# Patient Record
Sex: Male | Born: 1960
Health system: Southern US, Community
[De-identification: ages and names within clinical notes are randomized; demographics above are authoritative.]

---

## 2001-07-01 ENCOUNTER — Encounter: Payer: Self-pay | Admitting: Orthopaedic Surgery

## 2001-07-01 ENCOUNTER — Ambulatory Visit (HOSPITAL_COMMUNITY): Admission: RE | Admit: 2001-07-01 | Discharge: 2001-07-01 | Payer: Self-pay | Admitting: Orthopaedic Surgery

## 2003-09-13 IMAGING — CT CT RECONSTRUCTION
3 series · 16 of 33 positions shown, 19 images · non-contrast
Comparison: none

FINDINGS
CLINICAL DATA: RIGHT ELBOW PAIN.  RADIAL HEAD FRACTURE.
CT RIGHT ELBOW WITHOUT CONTRAST MEDIA/MULTIPLANAR RECONSTRUCTIONS
MULTIPLE CONTIGUOUS AXIAL IMAGES THROUGH THE RIGHT ELBOW PERFORMED AT 1.25 MM COLLIMATION.
SAGITTAL AND CORONAL REFORMATTED IMAGES PERFORMED.
THERE IS A NONDISPLACED RADIAL HEAD FRACTURE.  NO EVIDENCE OF SUBLUXATION OR DISLOCATION NOTED.  NO
EVIDENCE OF LOOSE BODY. NO OTHER SIGNIFICANT ABNORMALITIES.
IMPRESSION
NON-DISPLACED RADIAL HEAD FRACTURE.  NO EVIDENCE OF LOOSE BODY IN THE JOINT.

[Series 8200: — · axial · 0.40mm/px · z∈[-853,-772]mm · 8 of 161 slices shown, 10 images (1 of 3)]
[im 13/161  soft-tissue]
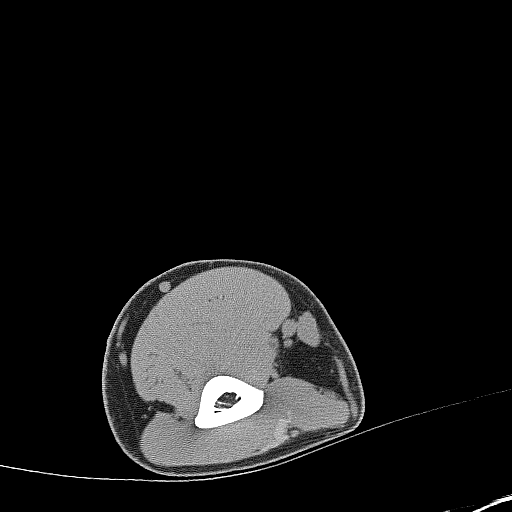
[im 13/161  bone]
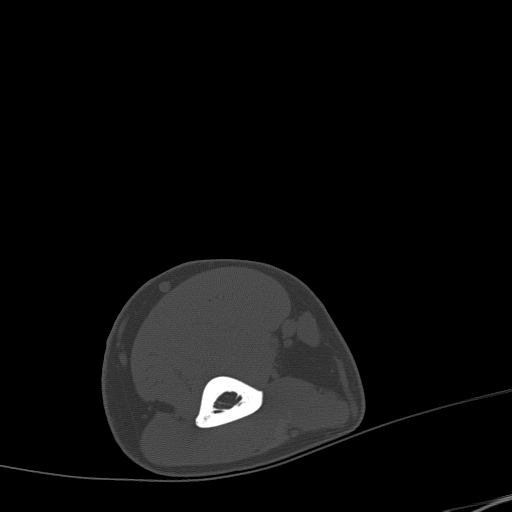
[im 37/161  bone]
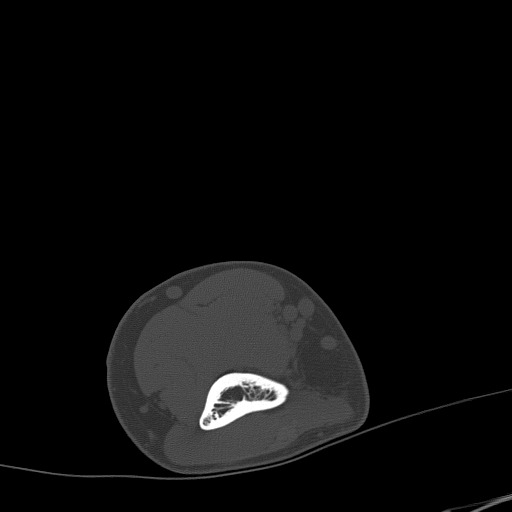
[im 50/161  bone]
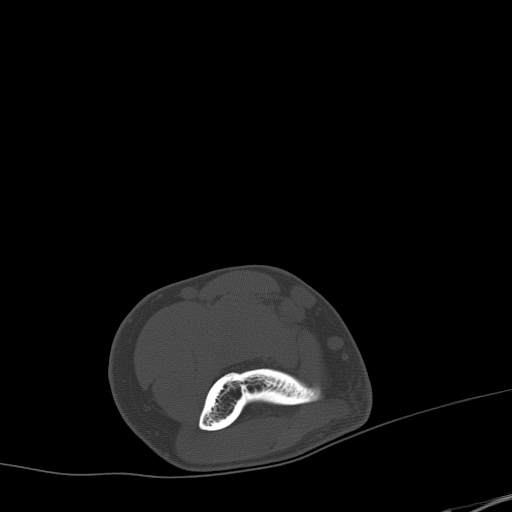
[im 74/161  bone]
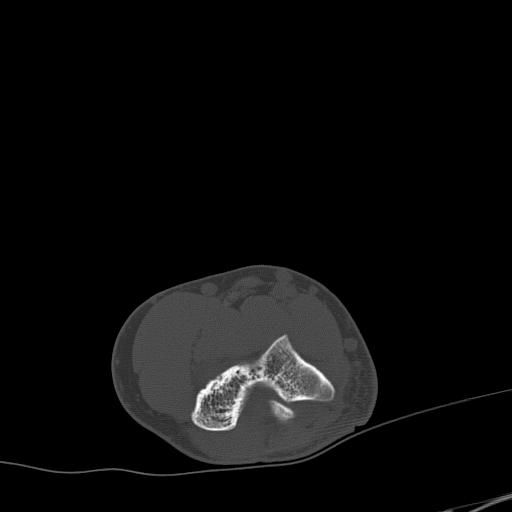
[im 87/161  soft-tissue]
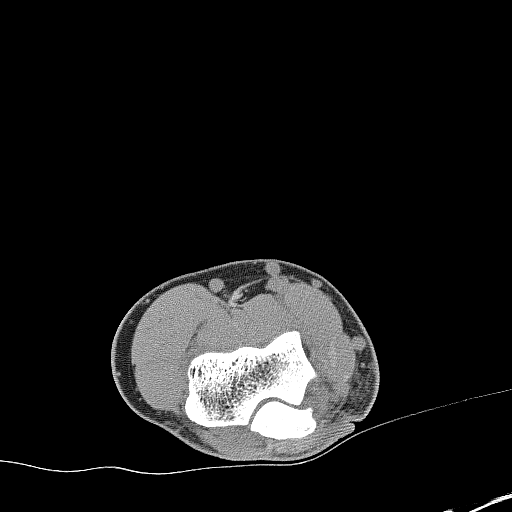
[im 87/161  bone]
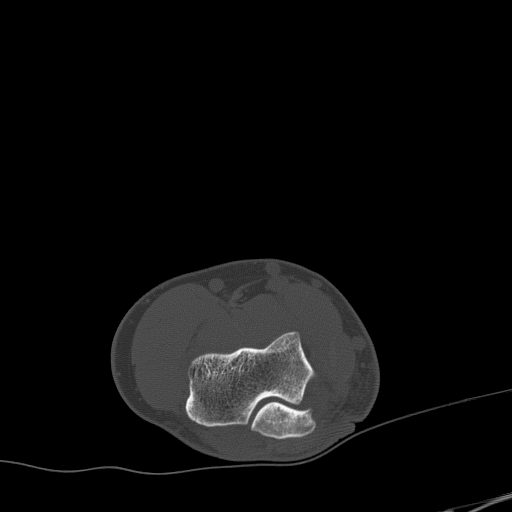
[im 111/161  bone]
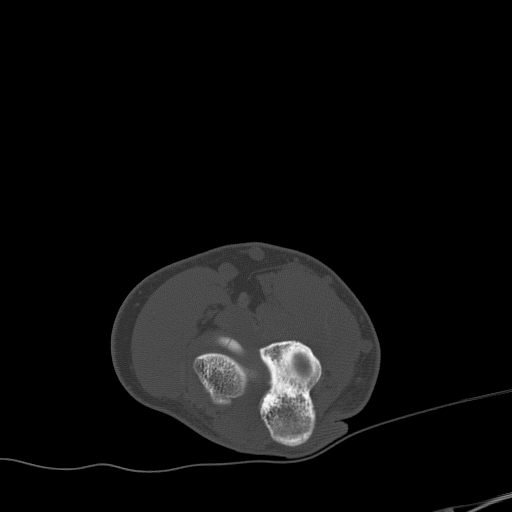
[im 124/161  bone]
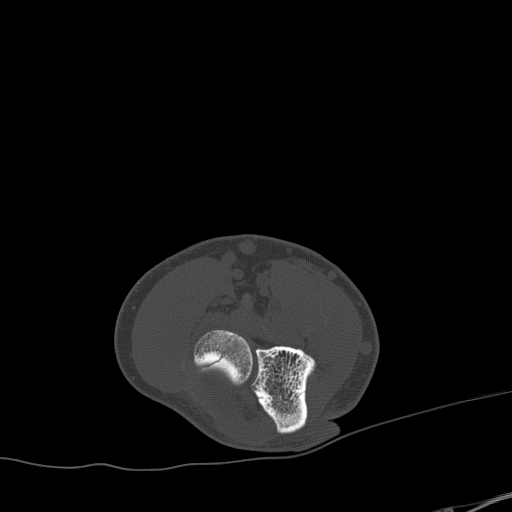
[im 148/161  bone]
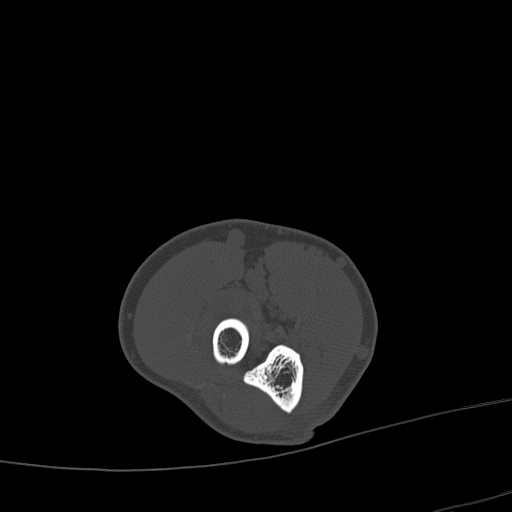

[— · sagittal · 0.40mm/px · 5 of 20 slices shown, 6 images (2 of 3)]
[im 7/20  bone]
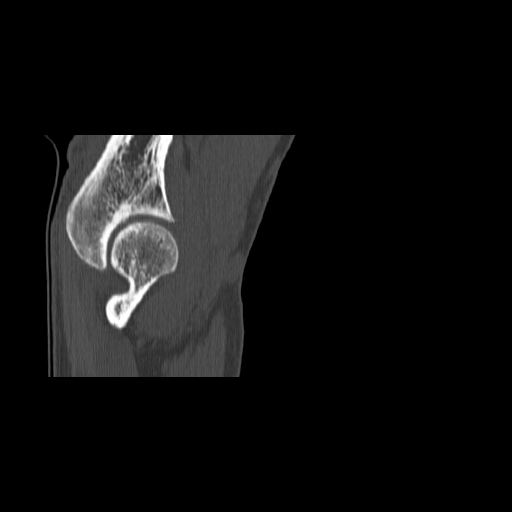
[im 8/20  bone]
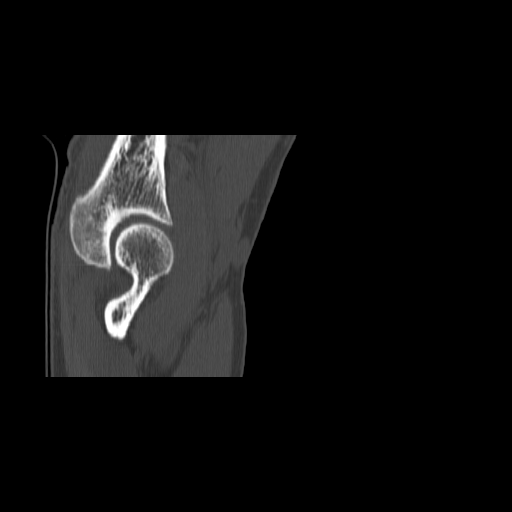
[im 10/20  soft-tissue]
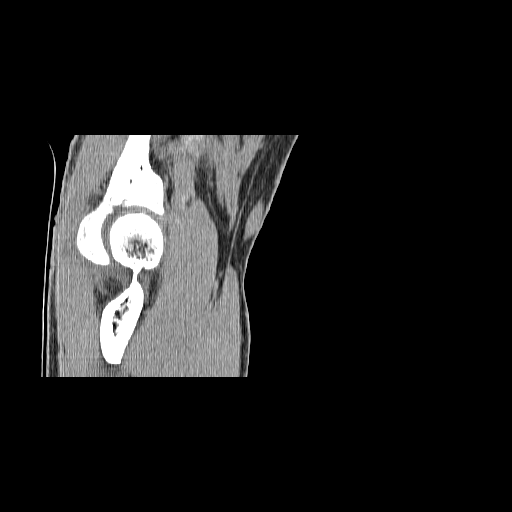
[im 10/20  bone]
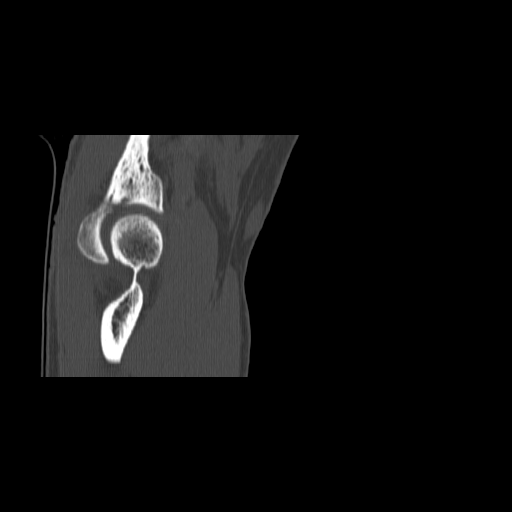
[im 12/20  bone]
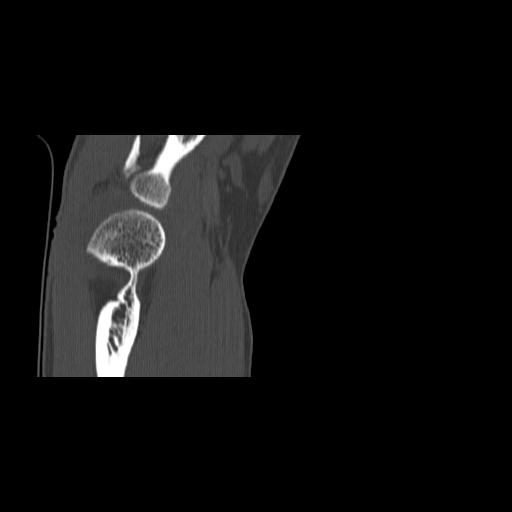
[im 13/20  bone]
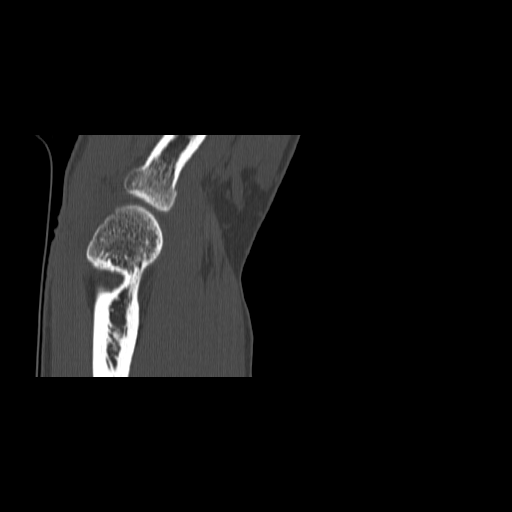

[— · coronal · 0.40mm/px · 3 of 19 slices shown (3 of 3)]
[im 4/19  bone]
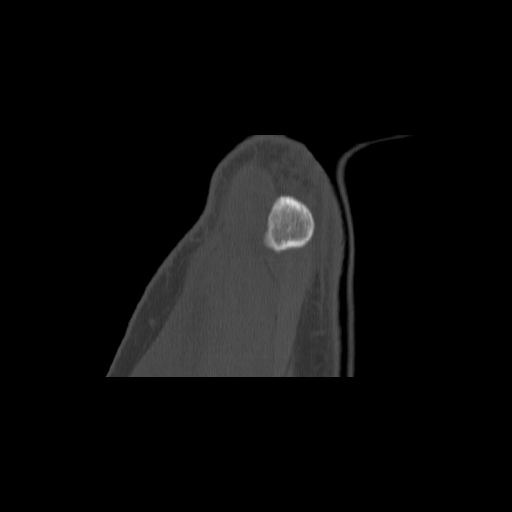
[im 8/19  bone]
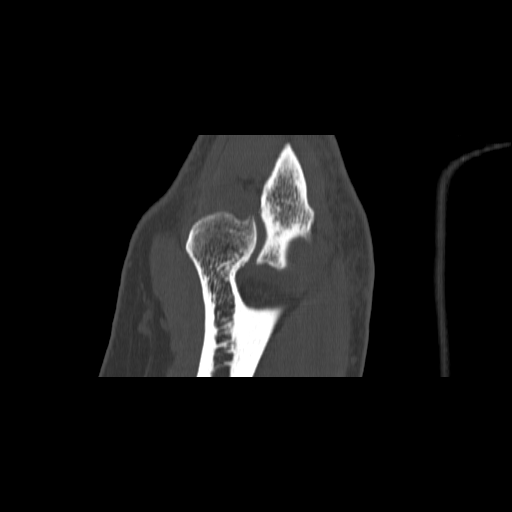
[im 11/19  bone]
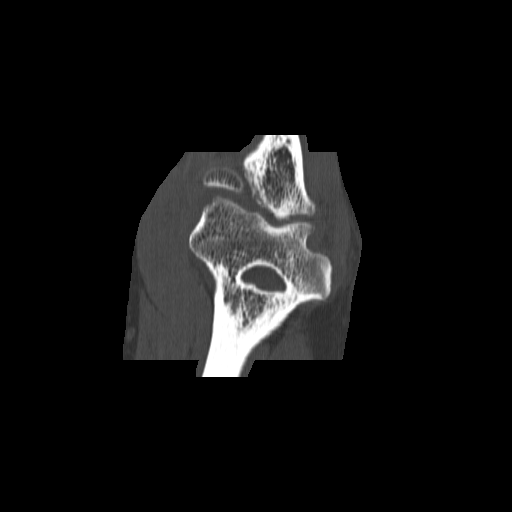

[16 of 33 positions shown; findings below may reference images not displayed]

## 2020-06-07 ENCOUNTER — Other Ambulatory Visit: Payer: Self-pay

## 2020-06-07 ENCOUNTER — Ambulatory Visit
Admission: EM | Admit: 2020-06-07 | Discharge: 2020-06-07 | Disposition: A | Discharge status: Home / Self Care | Payer: BC Managed Care – PPO | Attending: Emergency Medicine | Admitting: Emergency Medicine

## 2020-06-07 ENCOUNTER — Encounter: Payer: Self-pay | Admitting: Emergency Medicine

## 2020-06-07 DIAGNOSIS — R519 Headache, unspecified: Secondary | ICD-10-CM

## 2020-06-07 DIAGNOSIS — Z1152 Encounter for screening for COVID-19: Secondary | ICD-10-CM

## 2020-06-07 DIAGNOSIS — R509 Fever, unspecified: Secondary | ICD-10-CM

## 2020-06-07 DIAGNOSIS — J069 Acute upper respiratory infection, unspecified: Secondary | ICD-10-CM

## 2020-06-07 DIAGNOSIS — R52 Pain, unspecified: Secondary | ICD-10-CM | POA: Diagnosis not present

## 2020-06-07 MED ORDER — PREDNISONE 10 MG PO TABS: 20.0000 mg | ORAL_TABLET | Freq: Every day | ORAL | 0 refills | Status: AC

## 2020-06-07 MED ORDER — BENZONATATE 100 MG PO CAPS: 100.0000 mg | ORAL_CAPSULE | Freq: Three times a day (TID) | ORAL | 0 refills | Status: DC | PRN

## 2020-06-07 MED ORDER — CETIRIZINE HCL 10 MG PO TABS: 10.0000 mg | ORAL_TABLET | Freq: Every day | ORAL | 0 refills | Status: AC

## 2020-06-07 MED ORDER — KETOROLAC TROMETHAMINE 60 MG/2ML IM SOLN
30.0000 mg | Freq: Once | INTRAMUSCULAR | Status: AC
  Administered 2020-06-07: 30 mg via INTRAMUSCULAR

## 2020-06-07 MED ORDER — BENZONATATE 100 MG PO CAPS: 100.0000 mg | ORAL_CAPSULE | Freq: Four times a day (QID) | ORAL | 0 refills | Status: AC | PRN

## 2020-06-07 MED ORDER — ALBUTEROL SULFATE HFA 108 (90 BASE) MCG/ACT IN AERS: 1.0000 | INHALATION_SPRAY | Freq: Four times a day (QID) | RESPIRATORY_TRACT | 0 refills | Status: AC | PRN

## 2020-06-07 NOTE — Discharge Instructions (Signed)
COVID testing ordered.  It will take between 2-7 days for test results.  Someone will contact you regarding abnormal results.     Get plenty of rest and push fluids Tessalon Perles prescribed for cough Zyrtec for nasal congestion, runny nose, and/or sore throat Albuterol was prescribed for shortness of breath prednisone was prescribed Use medications daily for symptom relief Use OTC medications like ibuprofen or tylenol as needed fever or pain Call or go to the ED if you have any new or worsening symptoms such as fever, worsening cough, shortness of breath, chest tightness, chest pain, turning blue, changes in mental status, etc..Marland Kitchen

## 2020-06-07 NOTE — ED Triage Notes (Signed)
Head hurts, body aches, cough, fever and shortness of breath that started today.

## 2020-06-07 NOTE — ED Provider Notes (Signed)
[facility]   [id number] 06/07/20 Arrival Time: 0936   CC: COVID symptoms  SUBJECTIVE: History from: patient.  Cailan General is a 60 y.o. male who presented to the urgent care for complaint of fever, cough, shortness of breath, headache and body aches that started today.  Denies known sick exposure to COVID, flu or strep.  Denies recent travel.  Has tried OTC medication without relief.  Denies alleviating or aggravating factors.  Denies previous symptoms in the past.   Denies fever, chills, fatigue, sinus pain, rhinorrhea, sore throat, SOB, wheezing, chest pain, nausea, changes in bowel or bladder habits.     ROS: As per HPI.  All other pertinent ROS negative.      History reviewed. No pertinent past medical history. History reviewed. No pertinent surgical history. No Known Allergies No current facility-administered medications on file prior to encounter.   No current outpatient medications on file prior to encounter.   Social History   Socioeconomic History  . Marital status: Single    Spouse name: Not on file  . Number of children: Not on file  . Years of education: Not on file  . Highest education level: Not on file  Occupational History  . Not on file  Tobacco Use  . Smoking status: Never Smoker  . Smokeless tobacco: Never Used  Substance and Sexual Activity  . Alcohol use: Never  . Drug use: Never  . Sexual activity: Not on file  Other Topics Concern  . Not on file  Social History Narrative  . Not on file   Social Determinants of Health   Financial Resource Strain: Not on file  Food Insecurity: Not on file  Transportation Needs: Not on file  Physical Activity: Not on file  Stress: Not on file  Social Connections: Not on file  Intimate Partner Violence: Not on file   No family history on file.  OBJECTIVE:  Vitals:   06/07/20 1001 06/07/20 1003  BP:  110/78  Pulse:  97  Resp:  19  Temp:  99.1 F (37.3 C)  TempSrc:  Oral  SpO2:  95%   Weight: 175 lb (79.4 kg)   Height: 5\' 11"  (1.803 m)      General appearance: alert; appears fatigued, but nontoxic; speaking in full sentences and tolerating own secretions HEENT: NCAT; Ears: EACs clear, TMs pearly gray; Eyes: PERRL.  EOM grossly intact. Sinuses: nontender; Nose: nares patent without rhinorrhea, Throat: oropharynx clear, tonsils non erythematous or enlarged, uvula midline  Neck: supple without LAD Lungs: unlabored respirations, symmetrical air entry; cough: moderate; no respiratory distress; CTAB Heart: regular rate and rhythm.  Radial pulses 2+ symmetrical bilaterally Skin: warm and dry Psychological: alert and cooperative; normal mood and affect  LABS:  No results found for this or any previous visit (from the past 24 hour(s)).   ASSESSMENT & PLAN:  1. Encounter for screening for COVID-19   2. Body aches   3. Viral URI with cough   4. Fever, unspecified   5. Acute nonintractable headache, unspecified headache type     Meds ordered this encounter  Medications  . benzonatate (TESSALON) 100 MG capsule    Sig: Take 1 capsule (100 mg total) by mouth 3 (three) times daily as needed for cough.    Dispense:  30 capsule    Refill:  0  . albuterol (VENTOLIN HFA) 108 (90 Base) MCG/ACT inhaler    Sig: Inhale 1-2 puffs into the lungs every 6 (six) hours as needed for wheezing  or shortness of breath.    Dispense:  18 g    Refill:  0  . cetirizine (ZYRTEC ALLERGY) 10 MG tablet    Sig: Take 1 tablet (10 mg total) by mouth daily.    Dispense:  30 tablet    Refill:  0  . predniSONE (DELTASONE) 10 MG tablet    Sig: Take 2 tablets (20 mg total) by mouth daily.    Dispense:  15 tablet    Refill:  0  . ketorolac (TORADOL) injection 30 mg    Discharge instructions  COVID testing ordered.  It will take between 2-7 days for test results.  Someone will contact you regarding abnormal results.     Get plenty of rest and push fluids Tessalon Perles prescribed for  cough Zyrtec for nasal congestion, runny nose, and/or sore throat Albuterol was prescribed for shortness of breath prednisone was prescribed Use medications daily for symptom relief Use OTC medications like ibuprofen or tylenol as needed fever or pain Call or go to the ED if you have any new or worsening symptoms such as fever, worsening cough, shortness of breath, chest tightness, chest pain, turning blue, changes in mental status, etc...   Reviewed expectations re: course of current medical issues. Questions answered. Outlined signs and symptoms indicating need for more acute intervention. Patient verbalized understanding. After Visit Summary given.         Durward Parcel, FNP 06/07/20 1031

## 2020-06-09 LAB — COVID-19, FLU A+B NAA
Influenza A, NAA: NOT DETECTED
Influenza B, NAA: NOT DETECTED
SARS-CoV-2, NAA: DETECTED — AB

## 2020-06-13 ENCOUNTER — Ambulatory Visit (INDEPENDENT_AMBULATORY_CARE_PROVIDER_SITE_OTHER): Payer: BC Managed Care – PPO | Admitting: Medical

## 2020-06-13 VITALS — BP 115/81 | HR 95 | Temp 99.0°F | Wt 183.0 lb

## 2020-06-13 DIAGNOSIS — U071 COVID-19: Secondary | ICD-10-CM | POA: Diagnosis not present

## 2020-06-13 DIAGNOSIS — R5383 Other fatigue: Secondary | ICD-10-CM

## 2020-06-13 DIAGNOSIS — R0602 Shortness of breath: Secondary | ICD-10-CM

## 2020-06-13 DIAGNOSIS — R059 Cough, unspecified: Secondary | ICD-10-CM | POA: Diagnosis not present

## 2020-06-13 MED ORDER — EMERGEN-C IMMUNE PLUS PO PACK: 1.0000 | PACK | Freq: Two times a day (BID) | ORAL | 0 refills | Status: AC

## 2020-06-13 MED ORDER — SPACER/AERO-HOLDING CHAMBERS DEVI: 1.0000 | Freq: Three times a day (TID) | 0 refills | Status: AC

## 2020-06-13 NOTE — Addendum Note (Signed)
Addended by: Jac Canavan on: 06/13/2020 10:39 PM   Modules accepted: Level of Service

## 2020-06-13 NOTE — Progress Notes (Signed)
[facility]   Subjective:  Spencer Lucas is a 60 y.o. male who presents for respiratory illness.    PCP: Pcp, No  Here for follow-up on COVID illness.  His symptoms began on January 17 with a headache, body aches, cough, chills, coughing fits, shortness of breath, no energy.  He was evaluated on January 25 through urgent care.  He was prescribed albuterol, Tessalon Perles, Zyrtec and prednisone.  He just finished the prednisone.  He is using albuterol basically once in the morning as well as cough medicine once in the morning.  Overall he does feel significantly improved from last week but still feels fatigued cough and shortness of breath.  He is able to sleep now whereas prior he was not sleeping much with all the symptoms and cough fits.  He had some bad headaches but that has improved as well.  He did not get the COVID vaccine  Past history is significant for none    Patient is not a smoker. No other aggravating or relieving factors.  No other c/o.  History reviewed. No pertinent past medical history.  ROS as in subjective   Objective: BP 115/81 (BP Location: Left Arm, Patient Position: Sitting, Cuff Size: Normal)   Pulse 95   Temp 99 F (37.2 C) (Oral)   Wt 183 lb (83 kg)   SpO2 98%   BMI 25.52 kg/m   General appearance: Alert, WD/WN, no distress, mildly ill appearing, white male                             Skin: warm, no rash                           Head: no sinus tenderness                            Eyes: conjunctiva normal, corneas clear, PERRLA                            Ears: pearly TMs, external ear canals normal                          Nose: septum midline, turbinates without erythema and no discharge             Mouth/throat: somewhat dry MM, tongue normal, no pharyngeal erythema                           Neck: supple, no adenopathy, no thyromegaly, non tender                          Heart: RRR, normal S1, S2, no murmurs                          Lungs: In general some mildly decreased breath sounds, faint wheezes, but no rales, or rhonchi                         Ext: no calve asymetry, no lower extremity swelling, negative homans       Assessment  Encounter Diagnoses  Name Primary?  . COVID-19 virus infection Yes  . Cough   .  Fatigue, unspecified type   . SOB (shortness of breath)       Plan: Overall he is improving compared to original symptoms.  We discussed that he should take advantage of using the medicines he has already been prescribed.  I do feel like he could benefit from increased albuterol use.  I asked him to go ahead and use this at least 3 times a day.  I prescribed a spacer to use as he may not be doing it properly based on our discussion today  We discussed the importance of increased hydration, rest and gradual return activities as tolerated over the coming weeks.  We discussed the fact that COVID can cause significant fatigue and decreased energy.  We discussed the fact that the cough can linger as well.  Continue vitamins as below, continue the cough medication he was prescribed by urgent care.  He declines labs or chest x-ray today  Advise he consider follow-up here in the daytime Post  COVID clinic or establish primary care if not improving over the next several days  Spencer Lucas was seen today for covid positive.  Diagnoses and all orders for this visit:  COVID-19 virus infection  Cough  Fatigue, unspecified type  SOB (shortness of breath)  Other orders -     Spacer/Aero-Holding Chambers DEVI; 1 each by Does not apply route in the morning, at noon, and at bedtime. -     Multiple Vitamins-Minerals (EMERGEN-C IMMUNE PLUS) PACK; Take 1 tablet by mouth 2 (two) times daily.     Patient voiced understanding of diagnosis, recommendations, and treatment plan.  After visit summary given.    follow up as needed
